# Patient Record
Sex: Male | Born: 2000 | Marital: Single | Smoking: Never smoker
Health system: Southern US, Community
[De-identification: ages and names within clinical notes are randomized; demographics above are authoritative.]

## PROBLEM LIST (undated history)

## (undated) DIAGNOSIS — M419 Scoliosis, unspecified: Secondary | ICD-10-CM

## (undated) DIAGNOSIS — N200 Calculus of kidney: Secondary | ICD-10-CM

## (undated) DIAGNOSIS — G43909 Migraine, unspecified, not intractable, without status migrainosus: Secondary | ICD-10-CM

---
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
## 2022-01-06 ENCOUNTER — Ambulatory Visit: Payer: Self-pay | Admitting: *Deleted

## 2022-09-28 ENCOUNTER — Encounter: Payer: Self-pay | Admitting: *Deleted

## 2023-07-27 IMAGING — DX DG CHEST 1V PORT
1 series · 1 of 1 positions shown · non-contrast
Comparison: None.

CLINICAL DATA: Cough and fever.  Flu like symptoms for 2 weeks.

EXAM:
PORTABLE CHEST 1 VIEW

[chest]
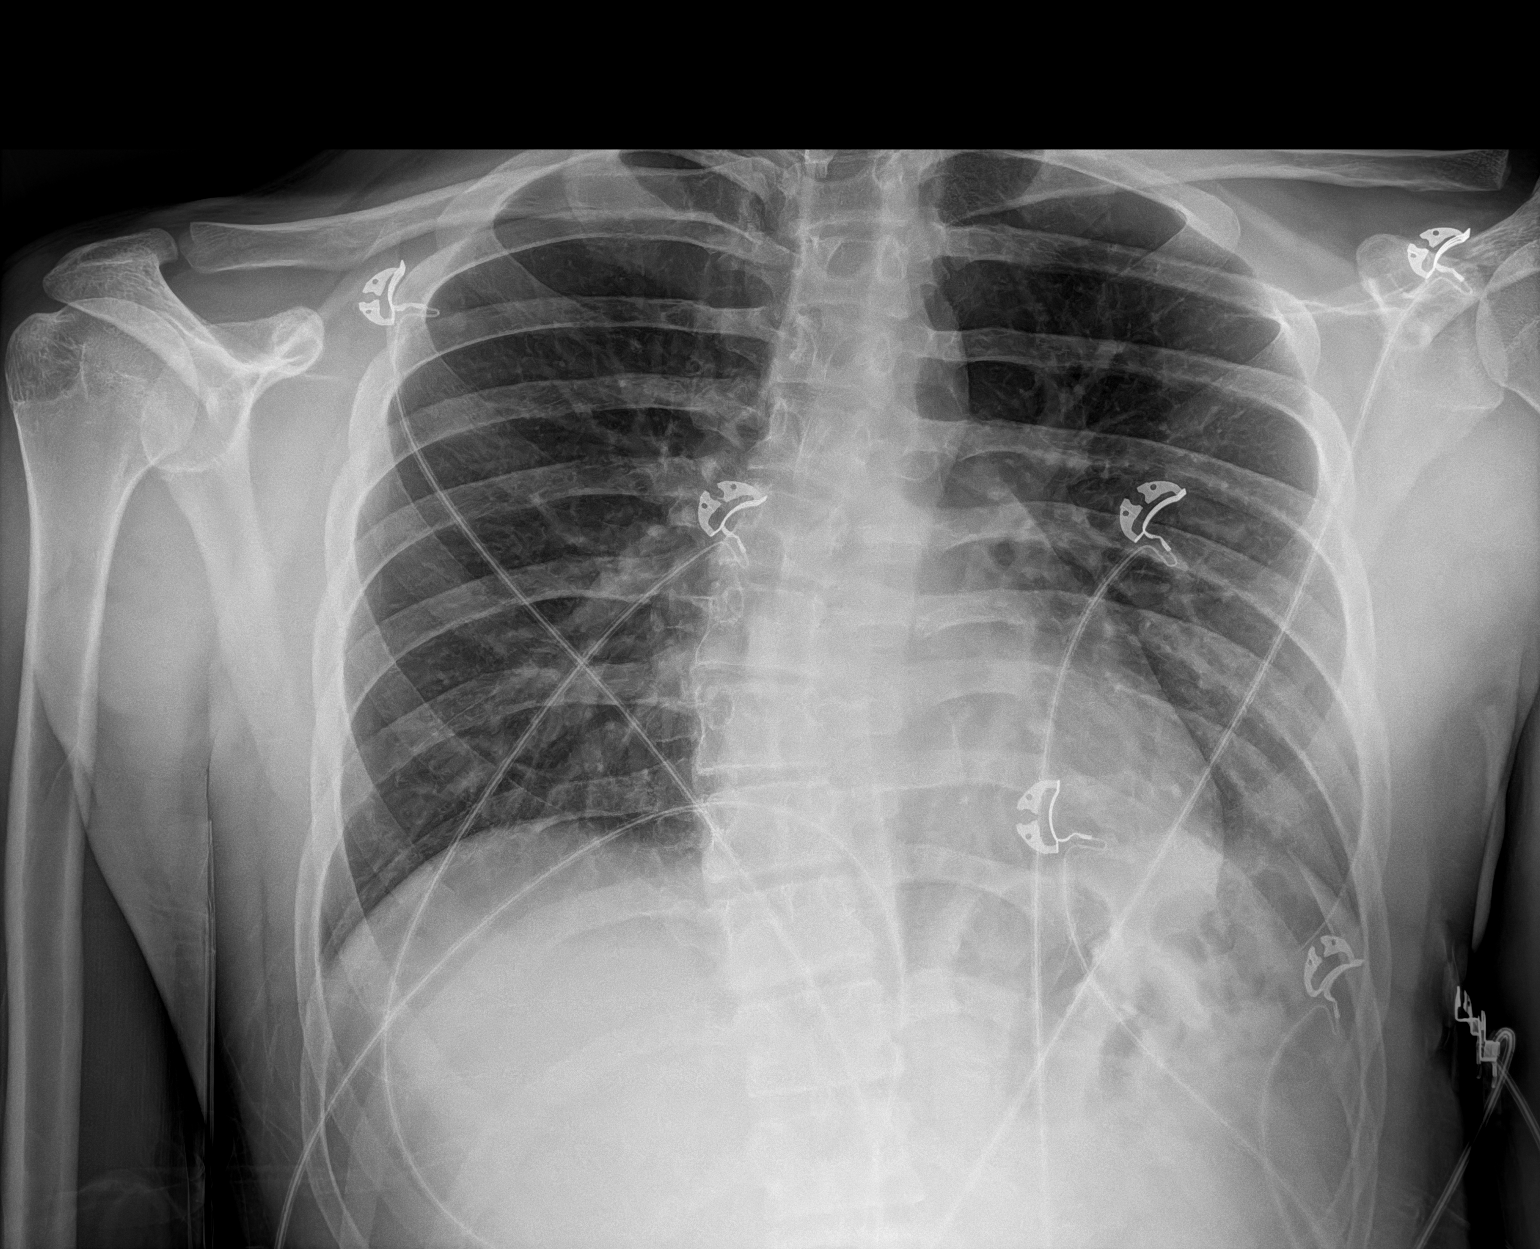

[1 of 1 positions shown; findings below may reference images not displayed]

FINDINGS: Trachea is midline. Heart size normal. Suspect peribronchovascular
opacities in the lower lobes, left greater than right. No pleural
fluid. Dextroconvex scoliosis.
IMPRESSION: Suspect mild basilar peribronchovascular opacities, which raises
suspicion for pneumonia.

## 2023-12-18 IMAGING — CT CT ABD-PELV W/O CM
2 of 4 series · 16 of 46 positions shown, 18 images · non-contrast
Comparison: None.

CLINICAL DATA: Evaluate right flank pain and microhematuria.



[Series 2: abd pel wo · axial · 0.71mm/px · z∈[-536,-96]mm · 13 of 96 slices shown, 15 images]
[im 4/96  soft-tissue]
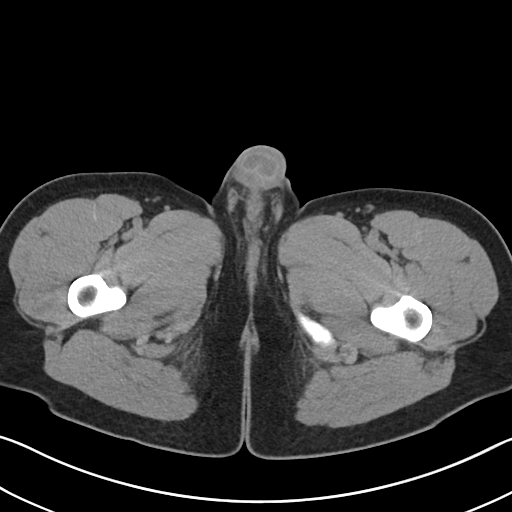
[im 4/96  bone]
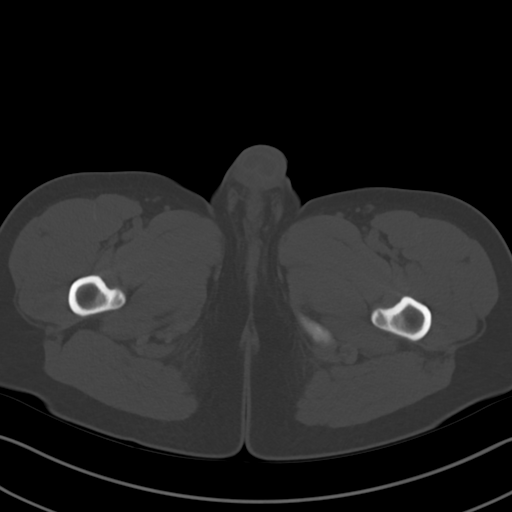
[im 12/96  soft-tissue]
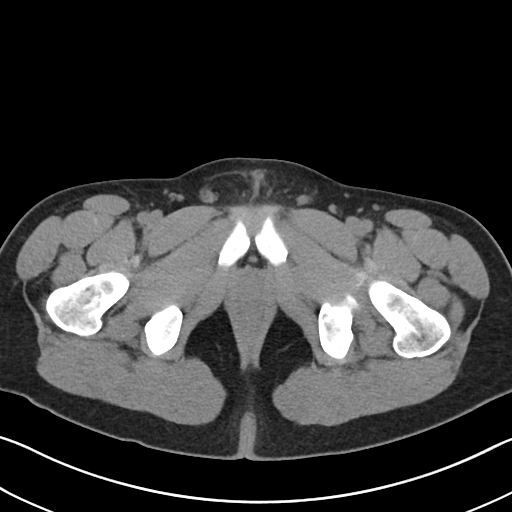
[im 20/96  soft-tissue]
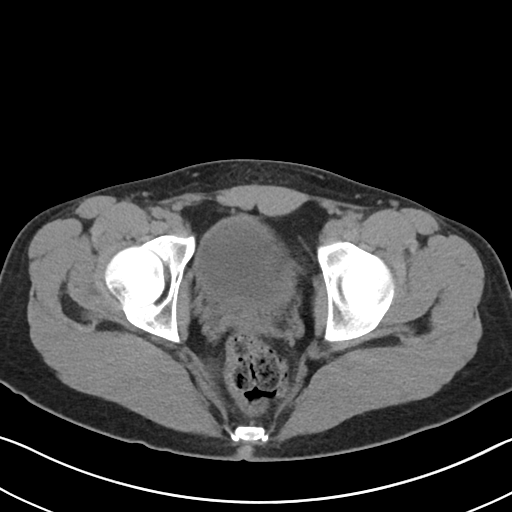
[im 27/96  soft-tissue]
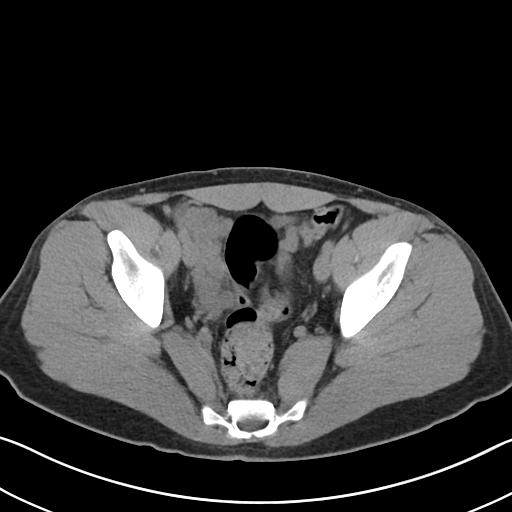
[im 35/96  soft-tissue]
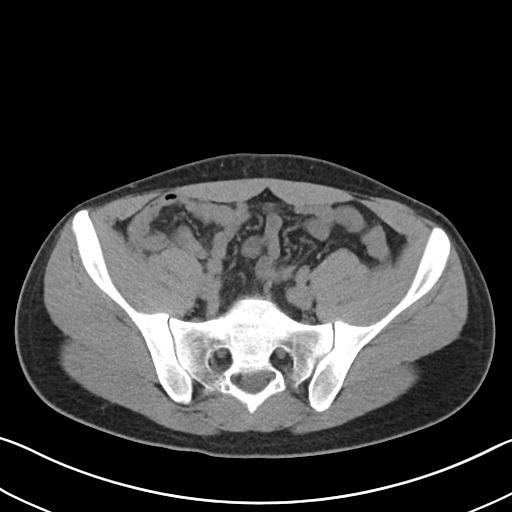
[im 42/96  soft-tissue]
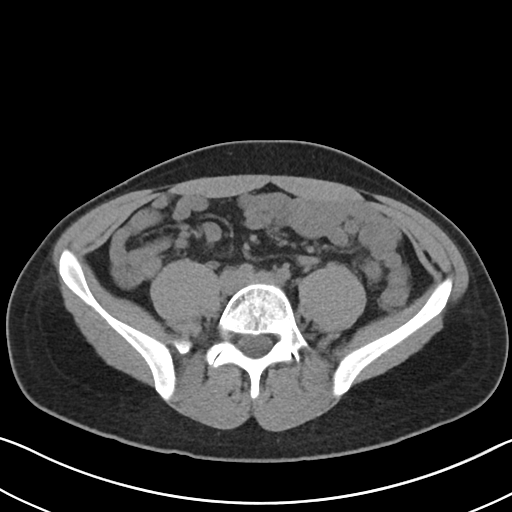
[im 50/96  soft-tissue]
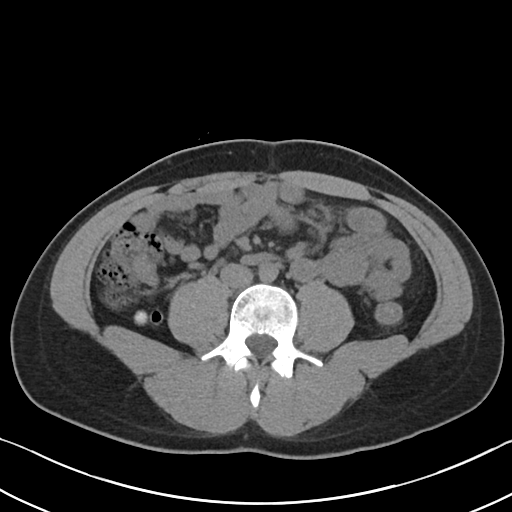
[im 54/96  soft-tissue]
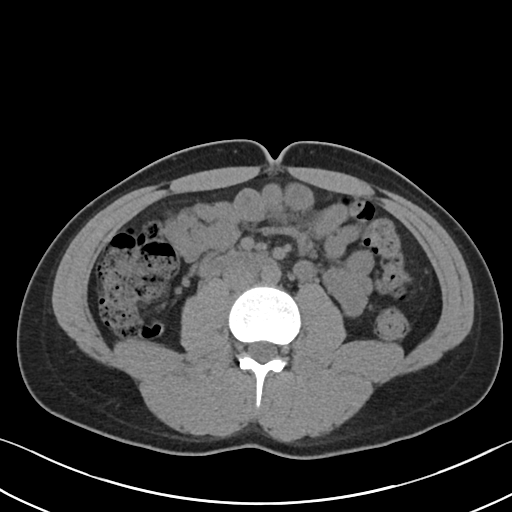
[im 61/96  soft-tissue]
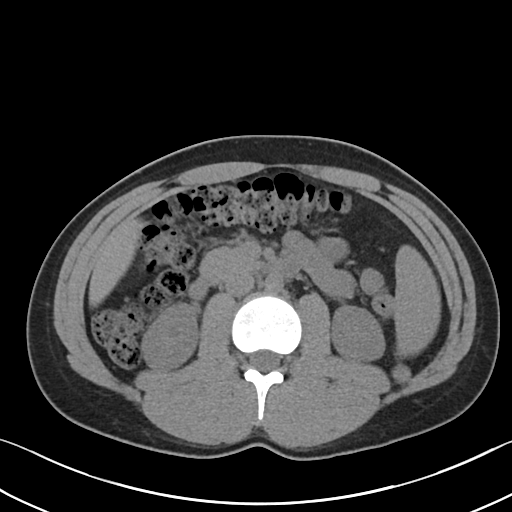
[im 61/96  bone]
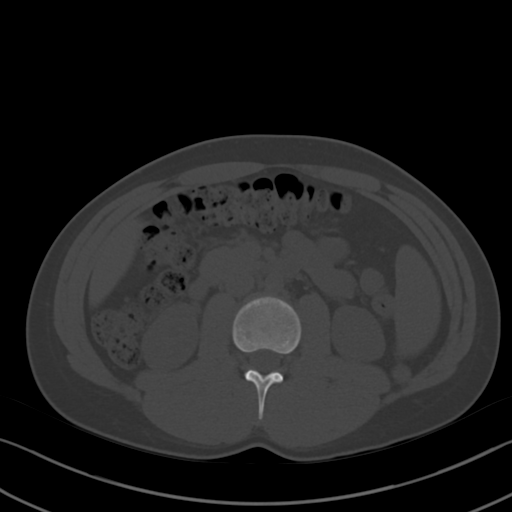
[im 69/96  soft-tissue]
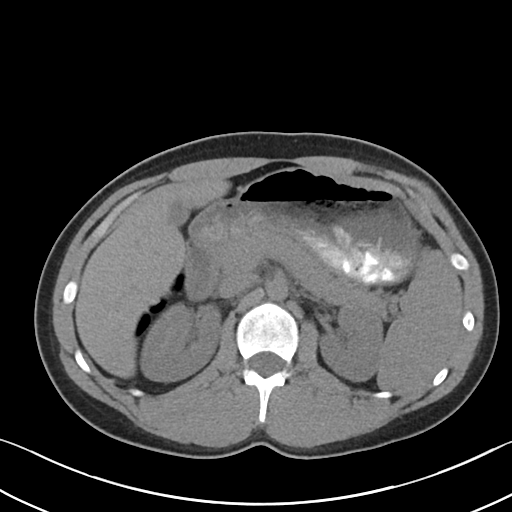
[im 77/96  soft-tissue]
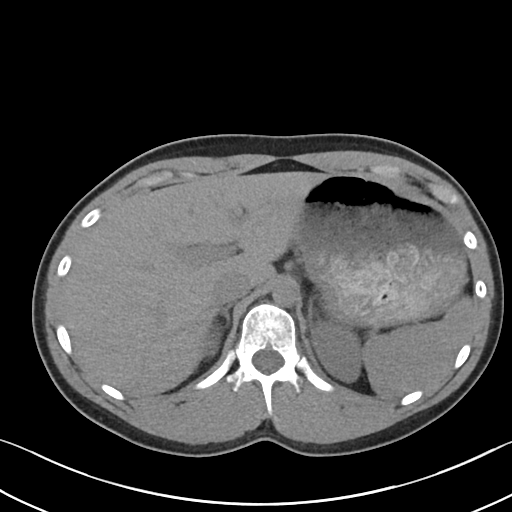
[im 84/96  soft-tissue]
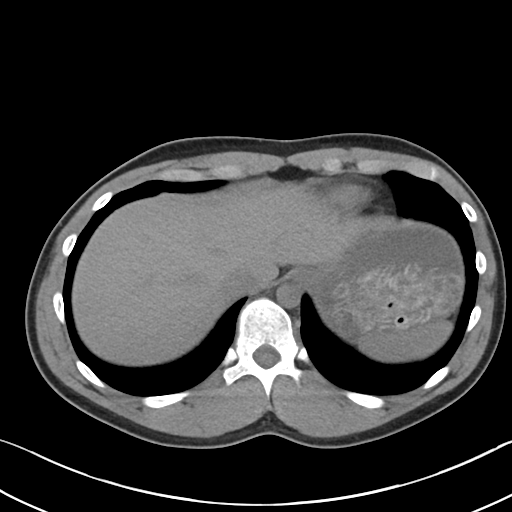
[im 92/96  soft-tissue]
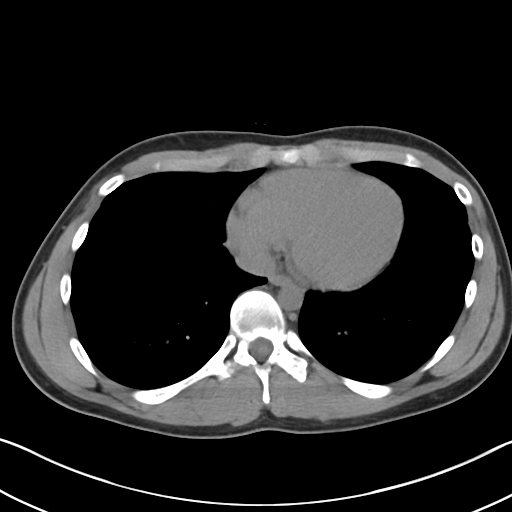

[Series 5: coronal · coronal · 0.79mm/px · 3 of 82 slices shown]
[im 28/82  soft-tissue]
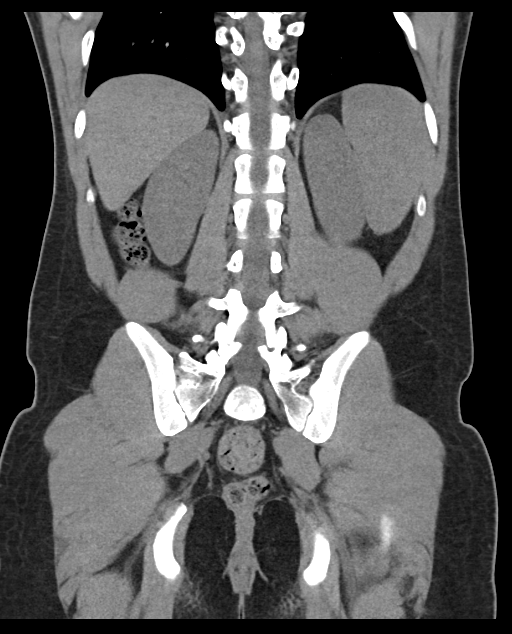
[im 37/82  soft-tissue]
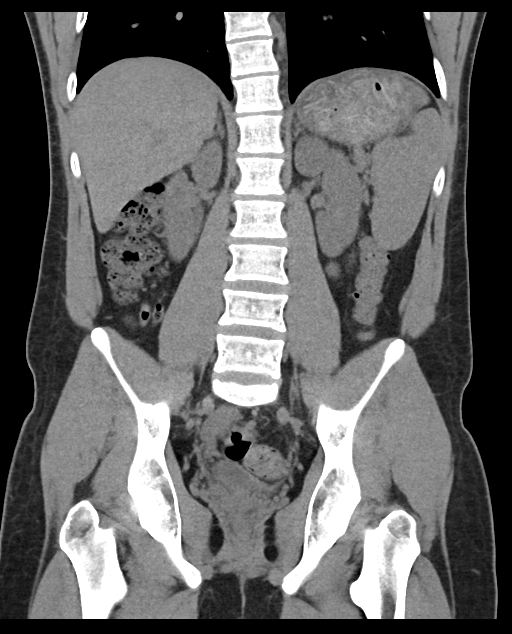
[im 46/82  soft-tissue]
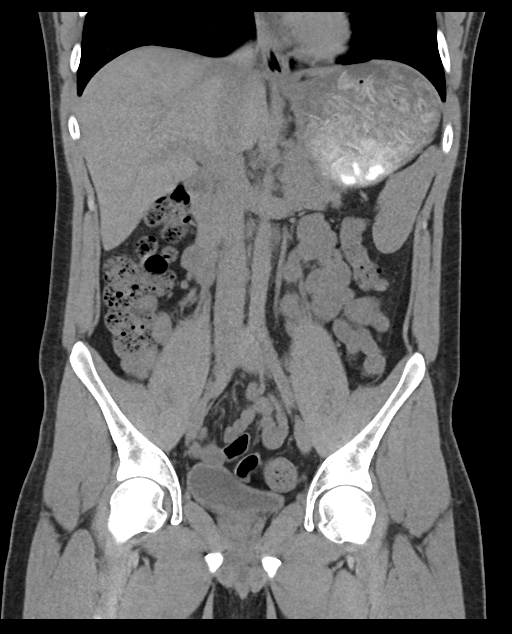

[16 of 46 positions shown; findings below may reference images not displayed]

FINDINGS: Lower chest: Lung bases are clear.  No pleural effusions.

Hepatobiliary: Normal appearance of the liver and gallbladder.

Pancreas: Unremarkable. No pancreatic ductal dilatation or
surrounding inflammatory changes.

Spleen: Normal in size without focal abnormality.

Adrenals/Urinary Tract: Normal adrenal glands. 4 mm stone in the
right renal pelvis. Negative for right hydronephrosis. Normal
appearance of the urinary bladder. Normal appearance of left kidney
without stones or hydronephrosis. No ureter stones.

Stomach/Bowel: High-density material in the appendix without
inflammatory changes. Normal appearance of the small bowel. Stomach
is mildly distended with a large amount of stomach contents. No
evidence for bowel obstruction or focal bowel inflammation.

Vascular/Lymphatic: No significant vascular findings are present. No
enlarged abdominal or pelvic lymph nodes.

Reproductive: Prostate is unremarkable.

Other: Negative for ascites. Negative for free air. Tiny umbilical
hernia containing fat.

Musculoskeletal: Probable Tarlov cysts in the lower sacrum. No acute
bone abnormality.
IMPRESSION: 1. Nonobstructive 4 mm stone in the right renal pelvis. Negative for
hydronephrosis.
2. High-density material in the appendix could represent a
developing appendicolith. No appendix inflammation.

## 2024-02-01 IMAGING — CT CT RENAL STONE PROTOCOL
3 of 4 series · 6 of 46 positions shown, 11 images · non-contrast
Comparison: None Available.

CLINICAL DATA: Flank pain



[Series 4: lungs · axial · 0.70mm/px · z∈[+1276,+1296]mm · 2 of 12 slices shown, 5 images]
[im 4/12  soft-tissue]
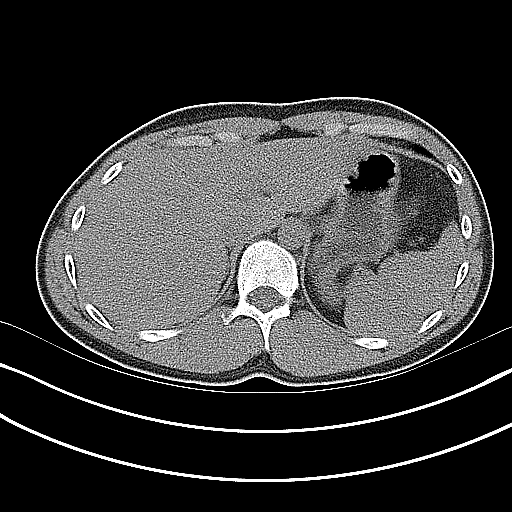
[im 4/12  lung]
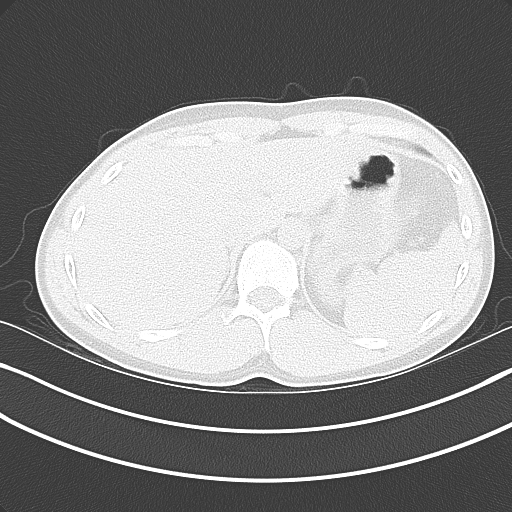
[im 4/12  bone]
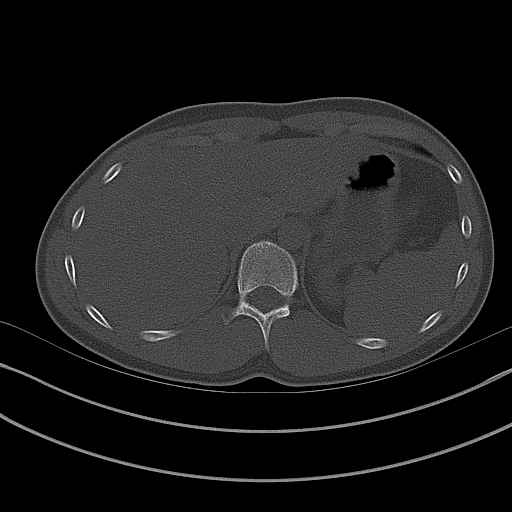
[im 8/12  soft-tissue]
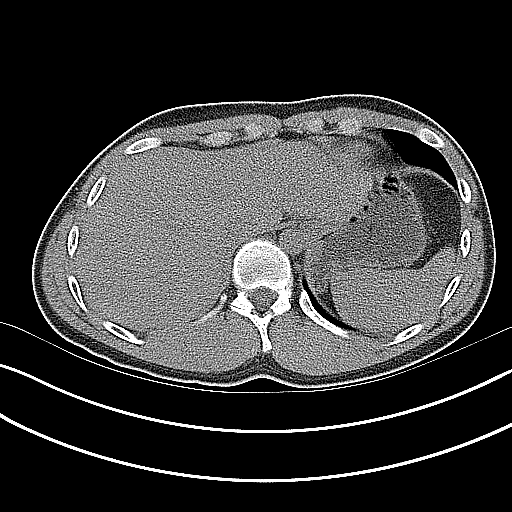
[im 8/12  lung]
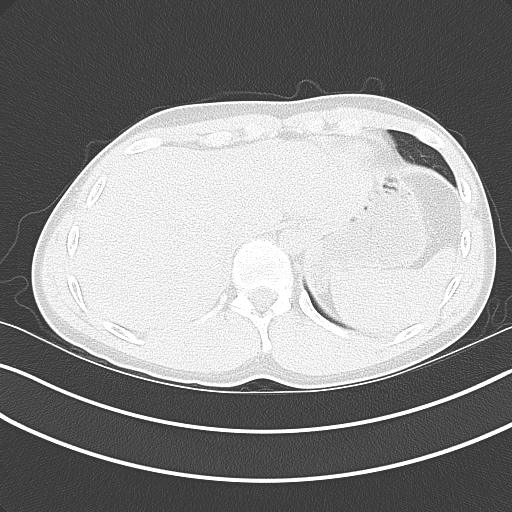

[Series 5: coronal · coronal · 0.76mm/px · 3 of 75 slices shown, 4 images]
[im 25/75  soft-tissue]
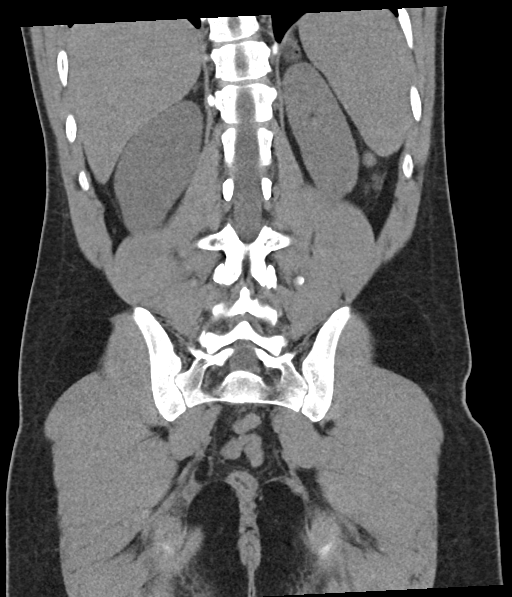
[im 33/75  soft-tissue]
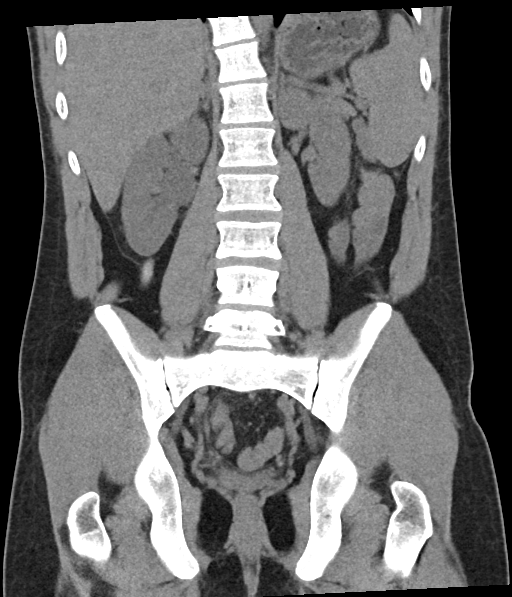
[im 33/75  bone]
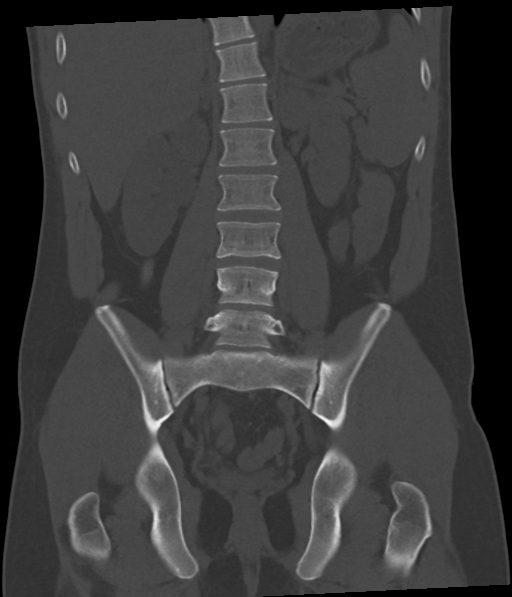
[im 42/75  soft-tissue]
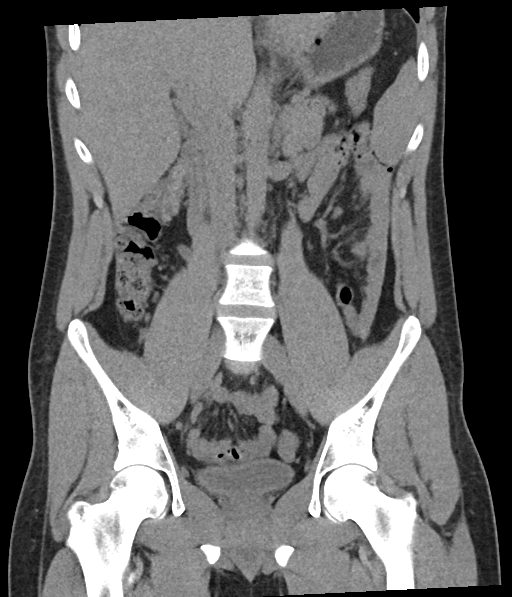

[Series 6: sagittal · sagittal · 0.44mm/px · 1 of 131 slices shown, 2 images]
[im 44/131  soft-tissue]
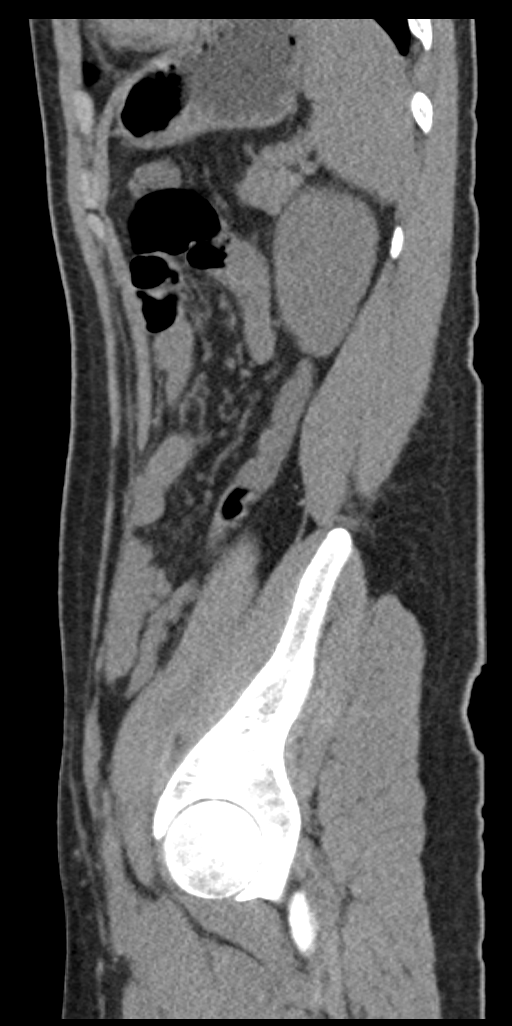
[im 44/131  bone]
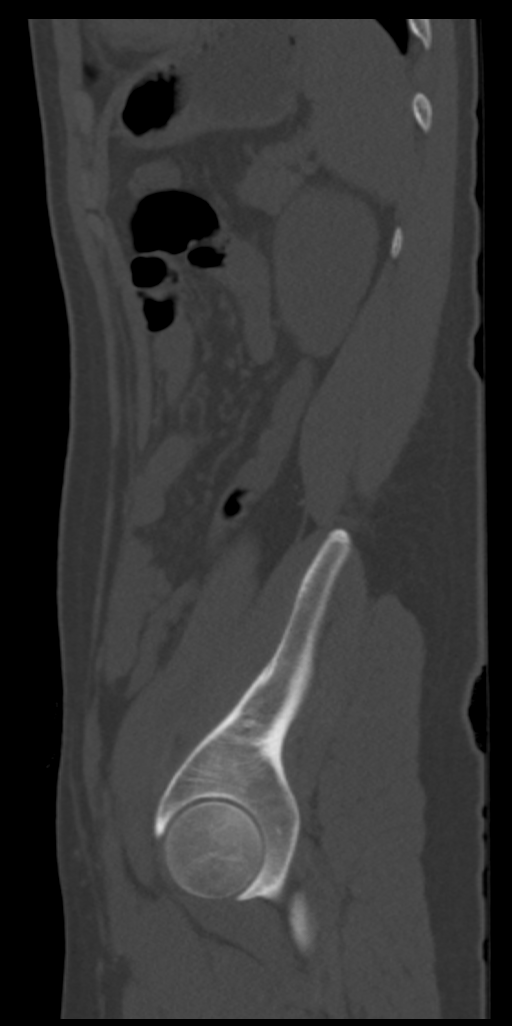

[6 of 46 positions shown; findings below may reference images not displayed]

FINDINGS: Lower chest: No acute abnormality.

Hepatobiliary: No focal liver abnormality is seen. No gallstones,
gallbladder wall thickening, or biliary dilatation.

Pancreas: Unremarkable. No pancreatic ductal dilatation or
surrounding inflammatory changes.

Spleen: Normal in size without focal abnormality.

Adrenals/Urinary Tract: Adrenal glands are normal. 5 mm obstructing
calculus at the right UVJ causing mild hydroureteronephrosis. No
nephrolithiasis identified bilaterally. Urinary bladder appears
otherwise normal.

Stomach/Bowel: No bowel obstruction, free air or pneumatosis. No
bowel wall edema identified. Stable appearance of the appendix which
contains a hyperdense appendicolith measuring up to 9 mm in
diameter, with no wall thickening or periappendiceal fat stranding.

Vascular/Lymphatic: No significant vascular findings are present. No
enlarged abdominal or pelvic lymph nodes.

Reproductive: Prostate is unremarkable.

Other: No abdominal wall hernia or abnormality. No abdominopelvic
ascites.

Musculoskeletal: No acute or significant osseous findings.
IMPRESSION: 5 mm obstructing calculus at the right UVJ. Mild right
hydroureteronephrosis.
